# Patient Record
Sex: Male | Born: 2005 | Race: Black or African American | Hispanic: No | Marital: Single | Smoking: Never smoker
Health system: Southern US, Community
[De-identification: ages and names within clinical notes are randomized; demographics above are authoritative.]

## PROBLEM LIST (undated history)

## (undated) HISTORY — PX: CIRCUMCISION: SUR203

---
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
## 2024-06-17 ENCOUNTER — Encounter (HOSPITAL_COMMUNITY): Payer: Self-pay

## 2024-06-17 ENCOUNTER — Ambulatory Visit (HOSPITAL_COMMUNITY): Payer: Self-pay

## 2024-06-17 ENCOUNTER — Ambulatory Visit (HOSPITAL_COMMUNITY)
Admission: EM | Admit: 2024-06-17 | Discharge: 2024-06-17 | Disposition: A | Discharge status: Home / Self Care | Attending: Family Medicine | Admitting: Family Medicine

## 2024-06-17 DIAGNOSIS — R519 Headache, unspecified: Secondary | ICD-10-CM | POA: Diagnosis present

## 2024-06-17 DIAGNOSIS — R632 Polyphagia: Secondary | ICD-10-CM | POA: Insufficient documentation

## 2024-06-17 DIAGNOSIS — R634 Abnormal weight loss: Secondary | ICD-10-CM | POA: Insufficient documentation

## 2024-06-17 DIAGNOSIS — R42 Dizziness and giddiness: Secondary | ICD-10-CM | POA: Diagnosis present

## 2024-06-17 LAB — COMPREHENSIVE METABOLIC PANEL WITH GFR
ALT: 28 U/L (ref 0–44)
AST: 36 U/L (ref 15–41)
Albumin: 4.3 g/dL (ref 3.5–5.0)
Alkaline Phosphatase: 82 U/L (ref 38–126)
Anion gap: 9 (ref 5–15)
BUN: 17 mg/dL (ref 6–20)
CO2: 27 mmol/L (ref 22–32)
Calcium: 9.6 mg/dL (ref 8.9–10.3)
Chloride: 99 mmol/L (ref 98–111)
Creatinine, Ser: 0.99 mg/dL (ref 0.61–1.24)
GFR, Estimated: >60 mL/min
Glucose, Bld: 82 mg/dL (ref 70–99)
Potassium: 4.7 mmol/L (ref 3.5–5.1)
Sodium: 135 mmol/L (ref 135–145)
Total Bilirubin: 0.5 mg/dL (ref 0.0–1.2)
Total Protein: 7.6 g/dL (ref 6.5–8.1)

## 2024-06-17 LAB — CBC WITH DIFFERENTIAL/PLATELET
Abs Immature Granulocytes: 0.04 K/uL (ref 0.00–0.07)
Basophils Absolute: 0 K/uL (ref 0.0–0.1)
Basophils Relative: 1 %
Eosinophils Absolute: 0.1 K/uL (ref 0.0–0.5)
Eosinophils Relative: 2 %
HCT: 45.2 % (ref 39.0–52.0)
Hemoglobin: 15.1 g/dL (ref 13.0–17.0)
Immature Granulocytes: 1 %
Lymphocytes Relative: 31 %
Lymphs Abs: 2 K/uL (ref 0.7–4.0)
MCH: 27.9 pg (ref 26.0–34.0)
MCHC: 33.4 g/dL (ref 30.0–36.0)
MCV: 83.5 fL (ref 80.0–100.0)
Monocytes Absolute: 0.5 K/uL (ref 0.1–1.0)
Monocytes Relative: 8 %
Neutro Abs: 3.8 K/uL (ref 1.7–7.7)
Neutrophils Relative %: 57 %
Platelets: 372 K/uL (ref 150–400)
RBC: 5.41 MIL/uL (ref 4.22–5.81)
RDW: 13.2 % (ref 11.5–15.5)
WBC: 6.5 K/uL (ref 4.0–10.5)
nRBC: 0 % (ref 0.0–0.2)

## 2024-06-17 LAB — HIV ANTIBODY (ROUTINE TESTING W REFLEX): HIV Screen 4th Generation wRfx: NONREACTIVE

## 2024-06-17 LAB — GLUCOSE, POCT (MANUAL RESULT ENTRY): POCT Glucose (KUC): 85 mg/dL (ref 70–99)

## 2024-06-17 NOTE — ED Provider Notes (Signed)
 " [facility]    CSN: [id number] Arrival date & time: 06/17/24  1104      History   Chief Complaint Chief Complaint  Patient presents with   metabolism problems    HPI Luke Reyes is a 19 y.o. male.   HPI Here for increased appetite and intermittent headaches.  He noticed that about a month ago he started being hungry all the time even after he has eaten a full meal.  He is drinking a lot of water but states he does not really feel like he feels dehydrated or thirsty.  He has been emptying his bladder a good bit but he states he feels like that is because he drinks a lot of water.  No dysuria and no known fever.  No abdominal pain or nausea or vomiting or diarrhea.  He has lost about 10 pounds over the last year. In the last 2 to 3 weeks he has started having right sided throbbing headaches that will occur after position changes or exertion or intercourse.  No dysuria or penile discharge or fever.  No rashes  NKDA  No vision changes.   History reviewed. No pertinent past medical history.  Patient Active Problem List   Diagnosis Date Noted   Headache 11/10/2012    Past Surgical History:  Procedure Laterality Date   CIRCUMCISION         Home Medications    Prior to Admission medications  Not on File    Family History Family History  Problem Relation Age of Onset   Asthma Maternal Grandfather     Social History Social History[1]   Allergies   Patient has no known allergies.   Review of Systems Review of Systems   Physical Exam Triage Vital Signs ED Triage Vitals  Encounter Vitals Group     BP 06/17/24 1134 124/75     Girls Systolic BP Percentile --      Girls Diastolic BP Percentile --      Boys Systolic BP Percentile --      Boys Diastolic BP Percentile --      Pulse Rate 06/17/24 1134 62     Resp 06/17/24 1134 16     Temp 06/17/24 1134 98.5 F (36.9 C)     Temp Source 06/17/24 1134 Oral     SpO2 06/17/24 1134 99 %      Weight --      Height --      Head Circumference --      Peak Flow --      Pain Score 06/17/24 1132 0     Pain Loc --      Pain Education --      Exclude from Growth Chart --    No data found.  Updated Vital Signs BP 124/75 (BP Location: Left Arm)   Pulse 62   Temp 98.5 F (36.9 C) (Oral)   Resp 16   SpO2 99%   Visual Acuity Right Eye Distance:   Left Eye Distance:   Bilateral Distance:    Right Eye Near:   Left Eye Near:    Bilateral Near:     Physical Exam Vitals reviewed.  Constitutional:      General: He is not in acute distress.    Appearance: He is not ill-appearing, toxic-appearing or diaphoretic.  HENT:     Nose: Nose normal.     Mouth/Throat:     Mouth: Mucous membranes are moist.     Pharynx: No  oropharyngeal exudate or posterior oropharyngeal erythema.  Eyes:     Extraocular Movements: Extraocular movements intact.     Conjunctiva/sclera: Conjunctivae normal.     Pupils: Pupils are equal, round, and reactive to light.     Funduscopic exam:    Right eye: No papilledema.        Left eye: No papilledema.  Cardiovascular:     Rate and Rhythm: Normal rate and regular rhythm.     Heart sounds: No murmur heard. Pulmonary:     Effort: Pulmonary effort is normal.     Breath sounds: Normal breath sounds.  Abdominal:     Palpations: Abdomen is soft.     Tenderness: There is no abdominal tenderness.  Musculoskeletal:     Cervical back: Neck supple.     Right lower leg: No edema.     Left lower leg: No edema.  Lymphadenopathy:     Cervical: No cervical adenopathy.  Skin:    Capillary Refill: Capillary refill takes less than 2 seconds.     Coloration: Skin is not jaundiced or pale.  Neurological:     General: No focal deficit present.     Mental Status: He is alert and oriented to person, place, and time.  Psychiatric:        Behavior: Behavior normal.      UC Treatments / Results  Labs (all labs ordered are listed, but only abnormal results are  displayed) Labs Reviewed  GLUCOSE, POCT (MANUAL RESULT ENTRY) - Normal  CBC WITH DIFFERENTIAL/PLATELET  COMPREHENSIVE METABOLIC PANEL WITH GFR  SYPHILIS: RPR W/REFLEX TO RPR TITER AND TREPONEMAL ANTIBODIES, TRADITIONAL SCREENING AND DIAGNOSIS ALGORITHM  HIV ANTIBODY (ROUTINE TESTING W REFLEX)    EKG   Radiology No results found.  Procedures Procedures (including critical care time)  Medications Ordered in UC Medications - No data to display  Initial Impression / Assessment and Plan / UC Course  I have reviewed the triage vital signs and the nursing notes.  Pertinent labs & imaging results that were available during my care of the patient were reviewed by me and considered in my medical decision making (see chart for details).     Fingerstick glucose is 85.  Lab is drawn to check his CBC and CMP and RPR and HIV.  Staff will notify him of any significant abnormal results  Staff will help him set up a primary care appointment  We discussed considering going to the emergency room today for his worrisome symptoms of exertional/positional headache and weight loss.  He states he does not really feel that sick.  The headaches go away within about 15 minutes.  We therefore decided to do the blood work as an initial evaluation for him.  He will go to the emergency room if he worsens in any way Final Clinical Impressions(s) / UC Diagnoses   Final diagnoses:  Dizziness  Increased appetite  Nonintractable episodic headache, unspecified headache type  Loss of weight     Discharge Instructions      Your sugar was 85  We have drawn blood to check blood counts, kidney and liver function and electrolytes, and for infections.  Staff will notify you if there is anything significantly abnormal  If you worsen in any way, please consider going to the emergency room for further evaluation      ED Prescriptions   None    PDMP not reviewed this encounter.     [1]  Social  History Tobacco Use   Smoking  status: Never    Passive exposure: Yes   Tobacco comments:    mom smokes outside  Substance Use Topics   Alcohol use: Never   Drug use: Yes    Types: Marijuana     Vonna Sharlet POUR, MD 06/17/24 1256  "

## 2024-06-17 NOTE — ED Triage Notes (Signed)
 Pt states having problems with his metabolism for past month. States he stays hungry all the time even after eating a full meal. States craving sugars, drinking a lot of water. States his mom said he may have a tape worm. Denies abdominal pain or n/d. States vomits after having sexually intercourse with a throbbing to rt temple since all this started.

## 2024-06-17 NOTE — Discharge Instructions (Signed)
 Your sugar was 85  We have drawn blood to check blood counts, kidney and liver function and electrolytes, and for infections.  Staff will notify you if there is anything significantly abnormal  If you worsen in any way, please consider going to the emergency room for further evaluation

## 2024-06-17 NOTE — Medical Student Note (Cosign Needed)
 Engineer, Site Note For educational purposes for Medical, PA and NP students only and not part of the legal medical record.   CSN: [id number] Arrival date & time: 06/17/24  1104      History   Chief Complaint Chief Complaint  Patient presents with   metabolism problems    HPI Luke Reyes is a 19 y.o. male.  19 year old presents for 2 weeks of increased appetite, not feeling full, chills, vomiting after sexual intercourse, pain in the temple, dizziness, and feeling off-balance. Reports pain in the temple has been present daily for the 2 months though has worsened in the last 2 weeks. Pain is increased with activity and when bending over. Pain has been on the left before, but it is mainly on the R side. Reports weight loss, though not acute. He cannot say with certainty he has not had a fever.   Denies increased thirst. Has been drinking more water and feels like he has been urinating more because of this. Denies blurred vision. Denies falls.  The history is provided by the patient.  No known allergies  History reviewed. No pertinent past medical history.  Patient Active Problem List   Diagnosis Date Noted   Headache 11/10/2012    Past Surgical History:  Procedure Laterality Date   CIRCUMCISION         Home Medications    Prior to Admission medications  Not on File    Family History Family History  Problem Relation Age of Onset   Asthma Maternal Grandfather     Social History Social History[1]   Allergies   Patient has no known allergies.   Review of Systems Review of Systems  Constitutional:  Positive for appetite change, chills and unexpected weight change. Negative for fatigue and fever.  Eyes:  Negative for visual disturbance.  Gastrointestinal:  Positive for vomiting. Negative for abdominal pain, blood in stool and diarrhea.  Endocrine: Positive for polyuria. Negative for polydipsia.  Musculoskeletal:  Negative for  arthralgias and myalgias.  Skin:  Negative for rash.  Neurological:  Positive for dizziness, light-headedness and headaches. Negative for syncope and speech difficulty.  Hematological:  Negative for adenopathy.  All other systems reviewed and are negative.    Physical Exam Updated Vital Signs BP 124/75 (BP Location: Left Arm)   Pulse 62   Temp 98.5 F (36.9 C) (Oral)   Resp 16   SpO2 99%   Physical Exam Vitals and nursing note reviewed.  Constitutional:      Appearance: Normal appearance.  HENT:     Head: Normocephalic.     Mouth/Throat:     Mouth: Mucous membranes are moist.     Pharynx: Oropharynx is clear.  Cardiovascular:     Rate and Rhythm: Normal rate and regular rhythm.     Heart sounds: Normal heart sounds.  Pulmonary:     Effort: Pulmonary effort is normal.     Breath sounds: Normal breath sounds.  Abdominal:     General: Abdomen is flat.     Palpations: Abdomen is soft.  Musculoskeletal:     Cervical back: Normal range of motion and neck supple.  Lymphadenopathy:     Cervical: No cervical adenopathy.  Skin:    General: Skin is warm and dry.  Neurological:     General: No focal deficit present.     Mental Status: He is alert and oriented to person, place, and time.  Psychiatric:  Mood and Affect: Mood normal.        Behavior: Behavior normal.      ED Treatments / Results  Labs (all labs ordered are listed, but only abnormal results are displayed) Labs Reviewed  GLUCOSE, POCT (MANUAL RESULT ENTRY) - Normal  CBC WITH DIFFERENTIAL/PLATELET  COMPREHENSIVE METABOLIC PANEL WITH GFR  SYPHILIS: RPR W/REFLEX TO RPR TITER AND TREPONEMAL ANTIBODIES, TRADITIONAL SCREENING AND DIAGNOSIS ALGORITHM  HIV ANTIBODY (ROUTINE TESTING W REFLEX)    EKG  Radiology No results found.  Procedures Procedures (including critical care time)  Medications Ordered in ED Medications - No data to display   Initial Impression / Assessment and Plan / ED Course   I have reviewed the triage vital signs and the nursing notes.  Pertinent labs & imaging results that were available during my care of the patient were reviewed by me and considered in my medical decision making (see chart for details).    Given patient's presentation with intermittent R sided temple/head pain worsened on exertion and positional changes in addition to vomiting, dizziness, and feeling off-balance over the last 2 weeks, advised patient it would be reasonable to have imaging done in the ED though it is not emergent. He declines to go today and states he does not feel sick. Advised him if symptoms worsen or progress, he should be evaluated in the ED.   Plan to check a CBC, CMP, HIV, PRP, and glucose today. Advised patient to establish with PCP for follow-up and will schedule visit in clinic by nursing staff.   Patient is in agreement with plan.   POC glucose 85  Final Clinical Impressions(s) / ED Diagnoses   Final diagnoses:  Dizziness  Increased appetite  Nonintractable episodic headache, unspecified headache type  Loss of weight    New Prescriptions New Prescriptions   No medications on file       [1]  Social History Tobacco Use   Smoking status: Never    Passive exposure: Yes   Tobacco comments:    mom smokes outside  Substance Use Topics   Alcohol use: Never   Drug use: Yes    Types: Marijuana   "

## 2024-06-18 LAB — SYPHILIS: RPR W/REFLEX TO RPR TITER AND TREPONEMAL ANTIBODIES, TRADITIONAL SCREENING AND DIAGNOSIS ALGORITHM: RPR Ser Ql: NONREACTIVE

## 2024-08-08 ENCOUNTER — Ambulatory Visit
